# Patient Record
Sex: Male | Born: 1975 | Hispanic: Yes | Marital: Single | Smoking: Never smoker
Health system: Southern US, Community
[De-identification: ages and names within clinical notes are randomized; demographics above are authoritative.]

---

## 2016-10-11 DIAGNOSIS — M9905 Segmental and somatic dysfunction of pelvic region: Secondary | ICD-10-CM | POA: Diagnosis not present

## 2016-10-11 DIAGNOSIS — M50323 Other cervical disc degeneration at C6-C7 level: Secondary | ICD-10-CM | POA: Diagnosis not present

## 2016-10-11 DIAGNOSIS — M9901 Segmental and somatic dysfunction of cervical region: Secondary | ICD-10-CM | POA: Diagnosis not present

## 2016-11-01 DIAGNOSIS — M47816 Spondylosis without myelopathy or radiculopathy, lumbar region: Secondary | ICD-10-CM | POA: Diagnosis not present

## 2016-11-01 DIAGNOSIS — M47812 Spondylosis without myelopathy or radiculopathy, cervical region: Secondary | ICD-10-CM | POA: Diagnosis not present

## 2016-12-05 DIAGNOSIS — M109 Gout, unspecified: Secondary | ICD-10-CM | POA: Diagnosis not present

## 2017-02-28 DIAGNOSIS — K219 Gastro-esophageal reflux disease without esophagitis: Secondary | ICD-10-CM | POA: Diagnosis not present

## 2017-02-28 DIAGNOSIS — M722 Plantar fascial fibromatosis: Secondary | ICD-10-CM | POA: Diagnosis not present

## 2017-02-28 DIAGNOSIS — R030 Elevated blood-pressure reading, without diagnosis of hypertension: Secondary | ICD-10-CM | POA: Diagnosis not present

## 2017-02-28 DIAGNOSIS — R197 Diarrhea, unspecified: Secondary | ICD-10-CM | POA: Diagnosis not present

## 2017-03-30 DIAGNOSIS — K625 Hemorrhage of anus and rectum: Secondary | ICD-10-CM | POA: Diagnosis not present

## 2017-03-30 DIAGNOSIS — R195 Other fecal abnormalities: Secondary | ICD-10-CM | POA: Diagnosis not present

## 2017-03-30 DIAGNOSIS — K219 Gastro-esophageal reflux disease without esophagitis: Secondary | ICD-10-CM | POA: Diagnosis not present

## 2017-04-14 ENCOUNTER — Other Ambulatory Visit: Payer: Self-pay | Admitting: Family Medicine

## 2017-04-14 ENCOUNTER — Ambulatory Visit
Admission: RE | Admit: 2017-04-14 | Discharge: 2017-04-14 | Disposition: A | Discharge status: Home / Self Care | Payer: Self-pay | Source: Ambulatory Visit | Attending: Family Medicine | Admitting: Family Medicine

## 2017-04-14 ENCOUNTER — Ambulatory Visit
Admission: RE | Admit: 2017-04-14 | Discharge: 2017-04-14 | Disposition: A | Discharge status: Home / Self Care | Payer: 59 | Source: Ambulatory Visit | Attending: Family Medicine | Admitting: Family Medicine

## 2017-04-14 DIAGNOSIS — R52 Pain, unspecified: Secondary | ICD-10-CM

## 2017-04-14 DIAGNOSIS — J309 Allergic rhinitis, unspecified: Secondary | ICD-10-CM | POA: Diagnosis not present

## 2017-04-14 DIAGNOSIS — M542 Cervicalgia: Secondary | ICD-10-CM

## 2017-04-14 DIAGNOSIS — K219 Gastro-esophageal reflux disease without esophagitis: Secondary | ICD-10-CM | POA: Diagnosis not present

## 2017-04-24 DIAGNOSIS — G8929 Other chronic pain: Secondary | ICD-10-CM | POA: Diagnosis not present

## 2017-04-24 DIAGNOSIS — M79672 Pain in left foot: Secondary | ICD-10-CM | POA: Diagnosis not present

## 2017-04-24 DIAGNOSIS — M722 Plantar fascial fibromatosis: Secondary | ICD-10-CM | POA: Diagnosis not present

## 2017-05-05 DIAGNOSIS — J309 Allergic rhinitis, unspecified: Secondary | ICD-10-CM | POA: Diagnosis not present

## 2017-05-05 DIAGNOSIS — K219 Gastro-esophageal reflux disease without esophagitis: Secondary | ICD-10-CM | POA: Diagnosis not present

## 2017-05-05 DIAGNOSIS — Z23 Encounter for immunization: Secondary | ICD-10-CM | POA: Diagnosis not present

## 2017-05-05 DIAGNOSIS — M542 Cervicalgia: Secondary | ICD-10-CM | POA: Diagnosis not present

## 2017-05-15 DIAGNOSIS — R030 Elevated blood-pressure reading, without diagnosis of hypertension: Secondary | ICD-10-CM | POA: Diagnosis not present

## 2017-05-15 DIAGNOSIS — J45909 Unspecified asthma, uncomplicated: Secondary | ICD-10-CM | POA: Diagnosis not present

## 2017-05-15 DIAGNOSIS — J309 Allergic rhinitis, unspecified: Secondary | ICD-10-CM | POA: Diagnosis not present

## 2017-05-24 DIAGNOSIS — K219 Gastro-esophageal reflux disease without esophagitis: Secondary | ICD-10-CM | POA: Diagnosis not present

## 2017-05-24 DIAGNOSIS — K921 Melena: Secondary | ICD-10-CM | POA: Diagnosis not present

## 2017-06-27 DIAGNOSIS — J4530 Mild persistent asthma, uncomplicated: Secondary | ICD-10-CM | POA: Diagnosis not present

## 2017-06-27 DIAGNOSIS — B370 Candidal stomatitis: Secondary | ICD-10-CM | POA: Diagnosis not present

## 2017-07-05 DIAGNOSIS — M722 Plantar fascial fibromatosis: Secondary | ICD-10-CM | POA: Diagnosis not present

## 2017-07-05 DIAGNOSIS — M79672 Pain in left foot: Secondary | ICD-10-CM | POA: Diagnosis not present

## 2017-07-05 DIAGNOSIS — G8929 Other chronic pain: Secondary | ICD-10-CM | POA: Diagnosis not present

## 2017-07-17 DIAGNOSIS — M722 Plantar fascial fibromatosis: Secondary | ICD-10-CM | POA: Diagnosis not present

## 2017-07-20 DIAGNOSIS — M722 Plantar fascial fibromatosis: Secondary | ICD-10-CM | POA: Diagnosis not present

## 2017-07-21 DIAGNOSIS — Z0000 Encounter for general adult medical examination without abnormal findings: Secondary | ICD-10-CM | POA: Diagnosis not present

## 2017-07-21 DIAGNOSIS — Z13220 Encounter for screening for lipoid disorders: Secondary | ICD-10-CM | POA: Diagnosis not present

## 2017-07-21 DIAGNOSIS — R7309 Other abnormal glucose: Secondary | ICD-10-CM | POA: Diagnosis not present

## 2017-07-24 DIAGNOSIS — R197 Diarrhea, unspecified: Secondary | ICD-10-CM | POA: Diagnosis not present

## 2017-07-28 DIAGNOSIS — B370 Candidal stomatitis: Secondary | ICD-10-CM | POA: Diagnosis not present

## 2017-07-28 DIAGNOSIS — J309 Allergic rhinitis, unspecified: Secondary | ICD-10-CM | POA: Diagnosis not present

## 2017-07-28 DIAGNOSIS — J45909 Unspecified asthma, uncomplicated: Secondary | ICD-10-CM | POA: Diagnosis not present

## 2017-08-04 DIAGNOSIS — K921 Melena: Secondary | ICD-10-CM | POA: Diagnosis not present

## 2017-08-04 DIAGNOSIS — M722 Plantar fascial fibromatosis: Secondary | ICD-10-CM | POA: Diagnosis not present

## 2017-08-04 DIAGNOSIS — K219 Gastro-esophageal reflux disease without esophagitis: Secondary | ICD-10-CM | POA: Diagnosis not present

## 2017-08-10 DIAGNOSIS — M722 Plantar fascial fibromatosis: Secondary | ICD-10-CM | POA: Diagnosis not present

## 2017-08-14 DIAGNOSIS — M722 Plantar fascial fibromatosis: Secondary | ICD-10-CM | POA: Diagnosis not present

## 2017-08-18 DIAGNOSIS — M722 Plantar fascial fibromatosis: Secondary | ICD-10-CM | POA: Diagnosis not present

## 2017-08-21 DIAGNOSIS — M722 Plantar fascial fibromatosis: Secondary | ICD-10-CM | POA: Diagnosis not present

## 2017-08-21 DIAGNOSIS — J309 Allergic rhinitis, unspecified: Secondary | ICD-10-CM | POA: Diagnosis not present

## 2017-08-21 DIAGNOSIS — K219 Gastro-esophageal reflux disease without esophagitis: Secondary | ICD-10-CM | POA: Diagnosis not present

## 2017-08-25 DIAGNOSIS — M722 Plantar fascial fibromatosis: Secondary | ICD-10-CM | POA: Diagnosis not present

## 2017-08-29 DIAGNOSIS — M722 Plantar fascial fibromatosis: Secondary | ICD-10-CM | POA: Diagnosis not present

## 2017-09-05 DIAGNOSIS — M722 Plantar fascial fibromatosis: Secondary | ICD-10-CM | POA: Diagnosis not present

## 2017-09-12 DIAGNOSIS — M722 Plantar fascial fibromatosis: Secondary | ICD-10-CM | POA: Diagnosis not present

## 2017-10-04 DIAGNOSIS — M79671 Pain in right foot: Secondary | ICD-10-CM | POA: Diagnosis not present

## 2017-10-04 DIAGNOSIS — M545 Low back pain: Secondary | ICD-10-CM | POA: Diagnosis not present

## 2017-10-04 DIAGNOSIS — M722 Plantar fascial fibromatosis: Secondary | ICD-10-CM | POA: Diagnosis not present

## 2017-12-13 DIAGNOSIS — K5901 Slow transit constipation: Secondary | ICD-10-CM | POA: Diagnosis not present

## 2017-12-13 DIAGNOSIS — K219 Gastro-esophageal reflux disease without esophagitis: Secondary | ICD-10-CM | POA: Diagnosis not present

## 2018-04-06 DIAGNOSIS — R1084 Generalized abdominal pain: Secondary | ICD-10-CM | POA: Diagnosis not present

## 2018-07-08 DIAGNOSIS — R339 Retention of urine, unspecified: Secondary | ICD-10-CM | POA: Diagnosis not present

## 2018-10-22 DIAGNOSIS — J309 Allergic rhinitis, unspecified: Secondary | ICD-10-CM | POA: Diagnosis not present

## 2018-10-22 DIAGNOSIS — J45909 Unspecified asthma, uncomplicated: Secondary | ICD-10-CM | POA: Diagnosis not present

## 2018-10-26 DIAGNOSIS — N529 Male erectile dysfunction, unspecified: Secondary | ICD-10-CM | POA: Diagnosis not present

## 2018-10-26 DIAGNOSIS — J45909 Unspecified asthma, uncomplicated: Secondary | ICD-10-CM | POA: Diagnosis not present

## 2019-01-17 IMAGING — CR DG CERVICAL SPINE 2 OR 3 VIEWS
4 series · 4 of 4 positions shown · non-contrast
Comparison: 10/01/2015

CLINICAL DATA: 41-year-old male with chronic neck pain for 1 year.

EXAM:
CERVICAL SPINE - 2-3 VIEW

[w cervical spine lat]
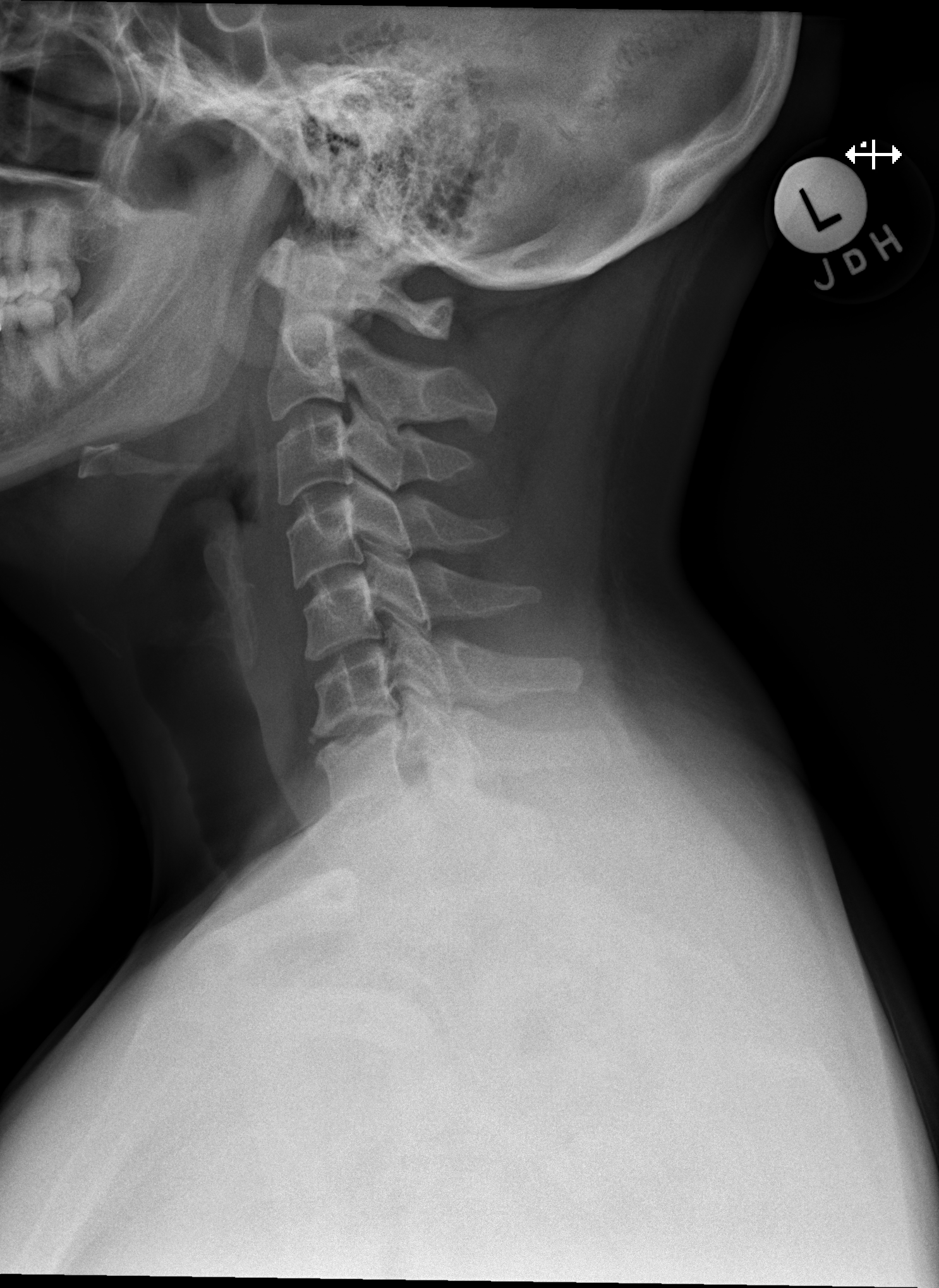

[w cervical spine ap]
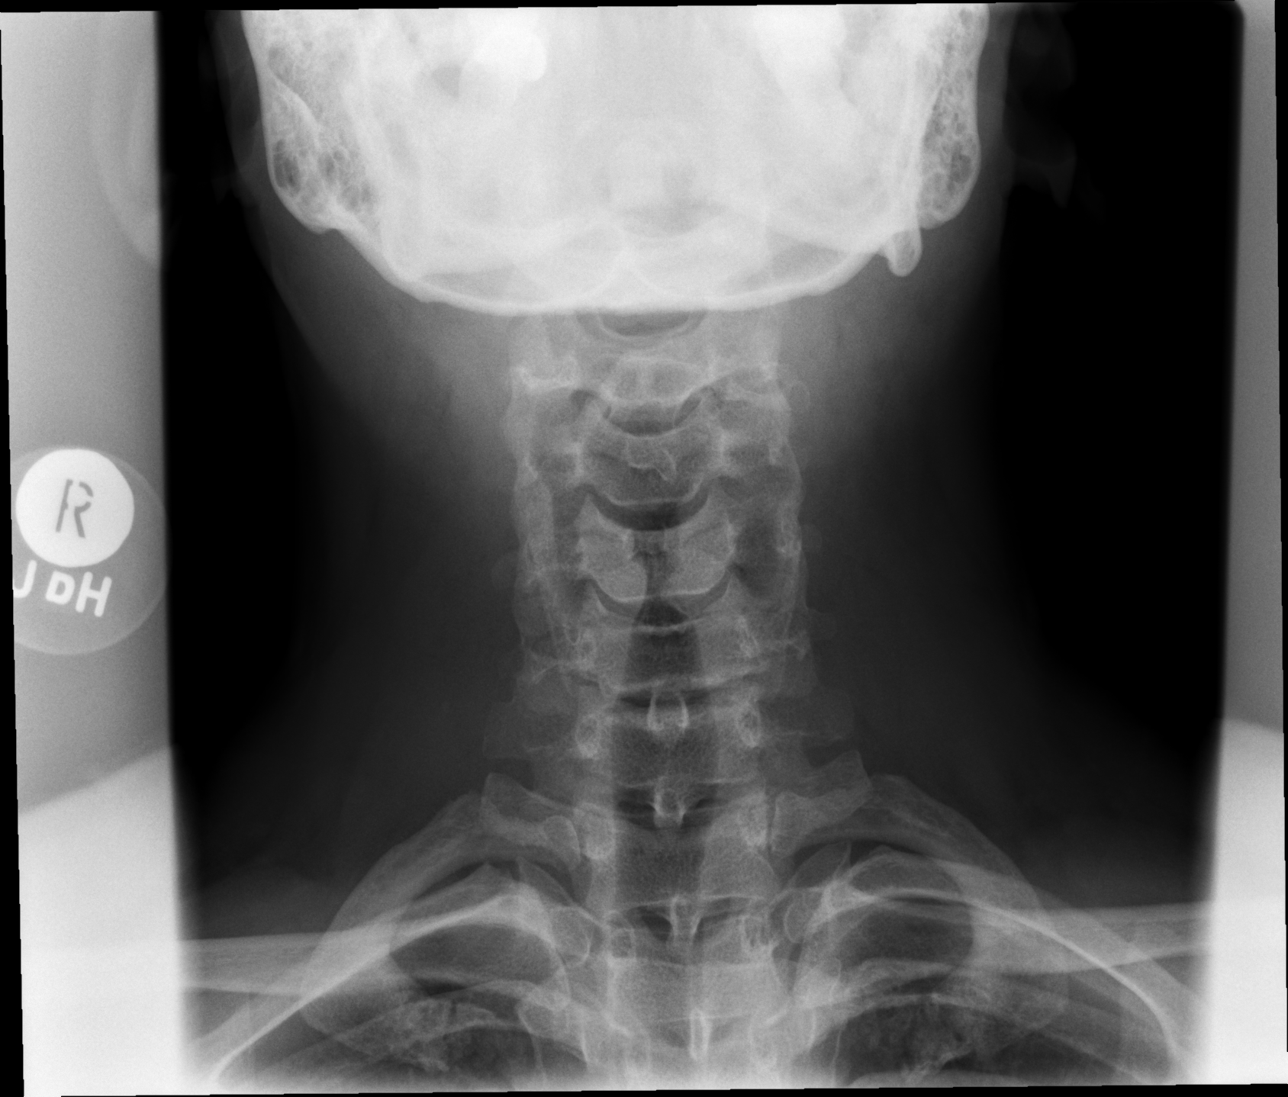

[w cervical swimmers]
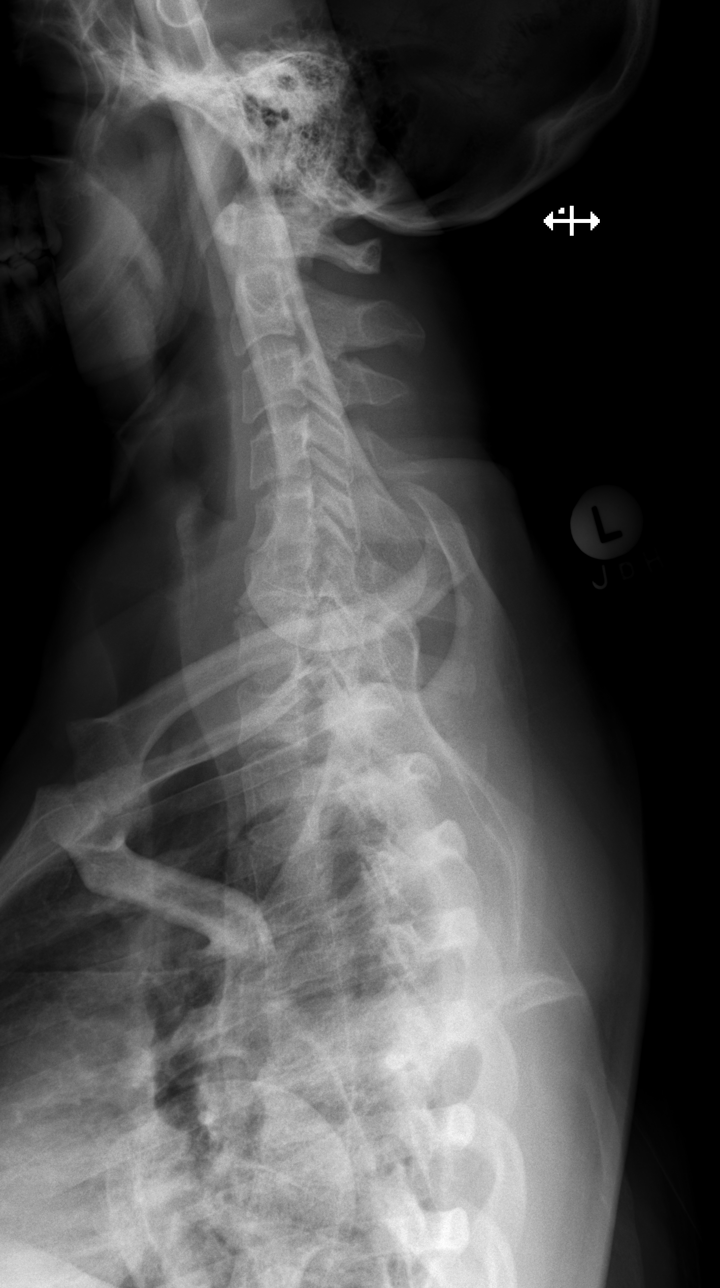

[w cervical spine odontoid]
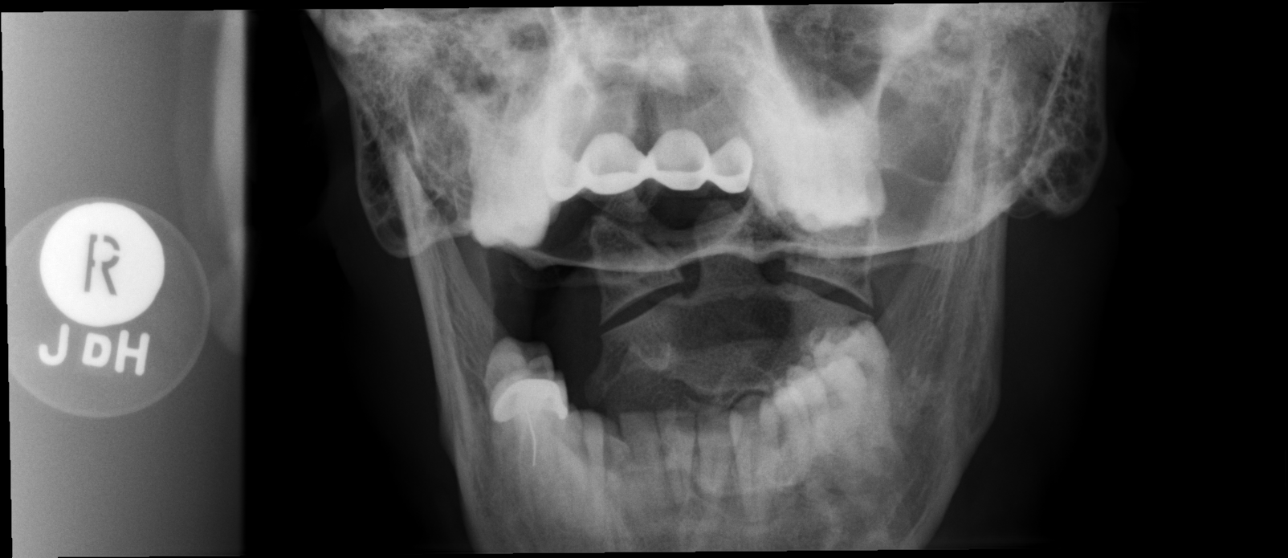

[4 of 4 positions shown; findings below may reference images not displayed]

FINDINGS: There is no evidence of cervical spine fracture or prevertebral soft
tissue swelling. Alignment is normal. No other significant bone
abnormalities are identified.
IMPRESSION: Negative cervical spine radiographs.

## 2020-10-21 ENCOUNTER — Other Ambulatory Visit: Payer: Self-pay

## 2020-10-21 ENCOUNTER — Encounter: Payer: Self-pay | Admitting: Cardiovascular Disease

## 2020-10-21 ENCOUNTER — Ambulatory Visit (INDEPENDENT_AMBULATORY_CARE_PROVIDER_SITE_OTHER): Payer: 59 | Admitting: Cardiovascular Disease

## 2020-10-21 DIAGNOSIS — R0789 Other chest pain: Secondary | ICD-10-CM | POA: Diagnosis not present

## 2020-10-21 NOTE — Patient Instructions (Signed)
Medication Instructions:  Your physician recommends that you continue on your current medications as directed. Please refer to the Current Medication list given to you today.  *If you need a refill on your cardiac medications before your next appointment, please call your pharmacy*   Testing/Procedures: Your physician has requested that you have an exercise tolerance test. For further information please visit [email or web address] Please also follow instruction sheet, as given.  This procedure is done at [address] [facility] 2nd Floor    Follow-Up: At [facility], you and your health needs are our priority.  As part of our continuing mission to provide you with exceptional heart care, we have created designated Provider Care Teams.  These Care Teams include your primary Cardiologist (physician) and Advanced Practice Providers (APPs -  Physician Assistants and Nurse Practitioners) who all work together to provide you with the care you need, when you need it.  We recommend signing up for the patient portal called "MyChart".  Sign up information is provided on this After Visit Summary.  MyChart is used to connect with patients for Virtual Visits (Telemedicine).  Patients are able to view lab/test results, encounter notes, upcoming appointments, etc.  Non-urgent messages can be sent to your provider as well.   To learn more about what you can do with MyChart, go to [email or web address].    Your next appointment:   No future appointments made at this time. We will see you on an as needed basis. If you need an appointment please call our office.   Provider:   Nanetta Batty, MD

## 2020-10-21 NOTE — Assessment & Plan Note (Signed)
William Hopkins was referred to me by Dr. Kirstie Peri for atypical chest pain.  He has no cardiac risk factors.  There is no family history.  He did have a cardiac evaluation in [place] approximately 7 or 8 years ago which was unremarkable.  He is been under a lot of stress at home for personal professional financial reasons.  He does complain of some pain in his neck rating to his left upper chest shoulder and upper extremity which sounds radicular.  I am going to get a routine GXT to further evaluate.

## 2020-10-21 NOTE — Progress Notes (Signed)
10/21/2020 William Hopkins   March 22, 1976  [id number]  Primary Physician Tally Joe, MD Primary Cardiologist: Runell Gess MD Nicholes Calamity, [place]  HPI:  William Hopkins is a 45 y.o. thin appearing married [place] male father of 2 children who works in Medical sales representative.  He was referred by Dr. Azucena Cecil for cardiovascular valuation because of atypical chest pain.  He has no cardiac risk factors.  He does run 2 to 4 miles a day and swims as well.  He has been working at home during the pandemic in front of a computer and has described pain in his neck shoulder upper left upper extremity and upper chest which does not sound ischemic in nature.   Current Meds  Medication Sig  . fluticasone (FLONASE) 50 MCG/ACT nasal spray Place into both nostrils daily.  . hydrOXYzine (ATARAX/VISTARIL) 25 MG tablet Take 25 mg by mouth as directed.  . loratadine (CLARITIN) 10 MG tablet Take 10 mg by mouth as needed for allergies.  . Saccharomyces boulardii (PROBIOTIC) 250 MG CAPS Take 1 capsule by mouth daily.  . sertraline (ZOLOFT) 25 MG tablet Take 25 mg by mouth daily.  . Vitamin D, Ergocalciferol, (DRISDOL) 1.25 MG (50000 UNIT) CAPS capsule Take 50,000 Units by mouth every 7 (seven) days.     Allergies  Allergen Reactions  . Milk-Related Compounds Diarrhea  . Omeprazole Diarrhea    Social History   Socioeconomic History  . Marital status: Single    Spouse name: Not on file  . Number of children: Not on file  . Years of education: Not on file  . Highest education level: Not on file  Occupational History  . Not on file  Tobacco Use  . Smoking status: Never Smoker  . Smokeless tobacco: Never Used  Substance and Sexual Activity  . Alcohol use: Never  . Drug use: Never  . Sexual activity: Yes    Partners: Female  Other Topics Concern  . Not on file  Social History Narrative  . Not on file   Social Determinants of Health   Financial Resource Strain: Not on  file  Food Insecurity: Not on file  Transportation Needs: Not on file  Physical Activity: Not on file  Stress: Not on file  Social Connections: Not on file  Intimate Partner Violence: Not on file     Review of Systems: General: negative for chills, fever, night sweats or weight changes.  Cardiovascular: negative for chest pain, dyspnea on exertion, edema, orthopnea, palpitations, paroxysmal nocturnal dyspnea or shortness of breath Dermatological: negative for rash Respiratory: negative for cough or wheezing Urologic: negative for hematuria Abdominal: negative for nausea, vomiting, diarrhea, bright red blood per rectum, melena, or hematemesis Neurologic: negative for visual changes, syncope, or dizziness All other systems reviewed and are otherwise negative except as noted above.    Blood pressure 122/78, pulse (!) 57, height 5\' 3"  (1.6 m), weight 132 lb (59.9 kg), SpO2 98 %.  General appearance: alert and no distress Neck: no adenopathy, no carotid bruit, no JVD, supple, symmetrical, trachea midline and thyroid not enlarged, symmetric, no tenderness/mass/nodules Lungs: clear to auscultation bilaterally Heart: regular rate and rhythm, S1, S2 normal, no murmur, click, rub or gallop Extremities: extremities normal, atraumatic, no cyanosis or edema Pulses: 2+ and symmetric Skin: Skin color, texture, turgor normal. No rashes or lesions Neurologic: Alert and oriented X 3, normal strength and tone. Normal symmetric reflexes. Normal coordination and gait  EKG sinus bradycardia 57 without ST or  T wave changes.  I personally reviewed this EKG.  ASSESSMENT AND PLAN:   Atypical chest pain Mr Walkins was referred to me by Dr. Kirstie Peri for atypical chest pain.  He has no cardiac risk factors.  There is no family history.  He did have a cardiac evaluation in [place] approximately 7 or 8 years ago which was unremarkable.  He is been under a lot of stress at home for personal  professional financial reasons.  He does complain of some pain in his neck rating to his left upper chest shoulder and upper extremity which sounds radicular.  I am going to get a routine GXT to further evaluate.      Runell Gess MD FACP,FACC,FAHA, [facility] 10/21/2020 3:09 PM

## 2020-10-26 ENCOUNTER — Other Ambulatory Visit (HOSPITAL_COMMUNITY)
Admission: RE | Admit: 2020-10-26 | Discharge: 2020-10-26 | Disposition: A | Discharge status: Home / Self Care | Payer: 59 | Source: Ambulatory Visit | Attending: Cardiovascular Disease | Admitting: Cardiovascular Disease

## 2020-10-26 DIAGNOSIS — Z20822 Contact with and (suspected) exposure to covid-19: Secondary | ICD-10-CM | POA: Insufficient documentation

## 2020-10-26 DIAGNOSIS — Z01812 Encounter for preprocedural laboratory examination: Secondary | ICD-10-CM | POA: Diagnosis not present

## 2020-10-26 LAB — SARS CORONAVIRUS 2 (TAT 6-24 HRS): SARS Coronavirus 2: NEGATIVE

## 2020-10-27 ENCOUNTER — Telehealth: Payer: Self-pay | Admitting: Cardiovascular Disease

## 2020-10-27 NOTE — Addendum Note (Signed)
Addended by: Runell Gess on: 10/27/2020 08:51 AM   Modules accepted: Orders

## 2020-10-27 NOTE — Telephone Encounter (Signed)
ETT tomorrow  Nuclear aware and will call patient to discuss.

## 2020-10-27 NOTE — Telephone Encounter (Signed)
New message:     Patient calling stating that he thought some one was going to call him with instruction on his apt tomorrow.

## 2020-10-28 ENCOUNTER — Ambulatory Visit (HOSPITAL_COMMUNITY)
Admission: RE | Admit: 2020-10-28 | Discharge: 2020-10-28 | Disposition: A | Discharge status: Home / Self Care | Payer: 59 | Source: Ambulatory Visit | Attending: Cardiovascular Disease | Admitting: Cardiovascular Disease

## 2020-10-28 ENCOUNTER — Other Ambulatory Visit: Payer: Self-pay

## 2020-10-28 DIAGNOSIS — R0789 Other chest pain: Secondary | ICD-10-CM | POA: Diagnosis not present

## 2020-10-29 LAB — EXERCISE TOLERANCE TEST
Estimated workload: 20 METS
Exercise duration (min): 18 min
Exercise duration (sec): 0 s
MPHR: 178 {beats}/min
Peak HR: 181 {beats}/min
Percent HR: 101 %
Rest HR: 55 {beats}/min

## 2022-06-03 DIAGNOSIS — N471 Phimosis: Secondary | ICD-10-CM | POA: Diagnosis not present

## 2022-06-03 DIAGNOSIS — R3914 Feeling of incomplete bladder emptying: Secondary | ICD-10-CM | POA: Diagnosis not present

## 2022-06-03 DIAGNOSIS — R109 Unspecified abdominal pain: Secondary | ICD-10-CM | POA: Diagnosis not present

## 2022-09-09 DIAGNOSIS — F419 Anxiety disorder, unspecified: Secondary | ICD-10-CM | POA: Diagnosis not present

## 2022-09-09 DIAGNOSIS — Z0000 Encounter for general adult medical examination without abnormal findings: Secondary | ICD-10-CM | POA: Diagnosis not present

## 2022-09-09 DIAGNOSIS — D229 Melanocytic nevi, unspecified: Secondary | ICD-10-CM | POA: Diagnosis not present

## 2022-09-09 DIAGNOSIS — E559 Vitamin D deficiency, unspecified: Secondary | ICD-10-CM | POA: Diagnosis not present

## 2022-09-09 DIAGNOSIS — E781 Pure hyperglyceridemia: Secondary | ICD-10-CM | POA: Diagnosis not present

## 2022-12-18 DIAGNOSIS — B009 Herpesviral infection, unspecified: Secondary | ICD-10-CM | POA: Diagnosis not present

## 2022-12-18 DIAGNOSIS — R21 Rash and other nonspecific skin eruption: Secondary | ICD-10-CM | POA: Diagnosis not present

## 2022-12-18 DIAGNOSIS — L0100 Impetigo, unspecified: Secondary | ICD-10-CM | POA: Diagnosis not present

## 2023-01-04 DIAGNOSIS — J4530 Mild persistent asthma, uncomplicated: Secondary | ICD-10-CM | POA: Diagnosis not present

## 2023-01-04 DIAGNOSIS — J069 Acute upper respiratory infection, unspecified: Secondary | ICD-10-CM | POA: Diagnosis not present

## 2023-04-07 DIAGNOSIS — E559 Vitamin D deficiency, unspecified: Secondary | ICD-10-CM | POA: Diagnosis not present

## 2023-04-07 DIAGNOSIS — J309 Allergic rhinitis, unspecified: Secondary | ICD-10-CM | POA: Diagnosis not present

## 2023-04-07 DIAGNOSIS — F419 Anxiety disorder, unspecified: Secondary | ICD-10-CM | POA: Diagnosis not present

## 2023-05-09 DIAGNOSIS — F411 Generalized anxiety disorder: Secondary | ICD-10-CM | POA: Diagnosis not present

## 2023-05-18 DIAGNOSIS — Z01818 Encounter for other preprocedural examination: Secondary | ICD-10-CM | POA: Diagnosis not present

## 2023-05-24 DIAGNOSIS — R3914 Feeling of incomplete bladder emptying: Secondary | ICD-10-CM | POA: Diagnosis not present

## 2023-05-24 DIAGNOSIS — F524 Premature ejaculation: Secondary | ICD-10-CM | POA: Diagnosis not present

## 2023-05-30 DIAGNOSIS — F411 Generalized anxiety disorder: Secondary | ICD-10-CM | POA: Diagnosis not present

## 2023-08-03 DIAGNOSIS — F411 Generalized anxiety disorder: Secondary | ICD-10-CM | POA: Diagnosis not present

## 2023-08-08 DIAGNOSIS — B001 Herpesviral vesicular dermatitis: Secondary | ICD-10-CM | POA: Diagnosis not present

## 2023-08-23 DIAGNOSIS — F411 Generalized anxiety disorder: Secondary | ICD-10-CM | POA: Diagnosis not present

## 2023-10-19 DIAGNOSIS — F411 Generalized anxiety disorder: Secondary | ICD-10-CM | POA: Diagnosis not present

## 2023-12-08 DIAGNOSIS — Z0000 Encounter for general adult medical examination without abnormal findings: Secondary | ICD-10-CM | POA: Diagnosis not present

## 2023-12-08 DIAGNOSIS — Z23 Encounter for immunization: Secondary | ICD-10-CM | POA: Diagnosis not present

## 2023-12-08 DIAGNOSIS — E781 Pure hyperglyceridemia: Secondary | ICD-10-CM | POA: Diagnosis not present

## 2023-12-08 DIAGNOSIS — E559 Vitamin D deficiency, unspecified: Secondary | ICD-10-CM | POA: Diagnosis not present

## 2023-12-08 DIAGNOSIS — F419 Anxiety disorder, unspecified: Secondary | ICD-10-CM | POA: Diagnosis not present

## 2024-01-18 DIAGNOSIS — F411 Generalized anxiety disorder: Secondary | ICD-10-CM | POA: Diagnosis not present

## 2024-05-02 DIAGNOSIS — F411 Generalized anxiety disorder: Secondary | ICD-10-CM | POA: Diagnosis not present

## 2024-06-10 DIAGNOSIS — J309 Allergic rhinitis, unspecified: Secondary | ICD-10-CM | POA: Diagnosis not present

## 2024-06-10 DIAGNOSIS — R0789 Other chest pain: Secondary | ICD-10-CM | POA: Diagnosis not present

## 2024-06-10 DIAGNOSIS — E559 Vitamin D deficiency, unspecified: Secondary | ICD-10-CM | POA: Diagnosis not present

## 2024-06-10 DIAGNOSIS — B349 Viral infection, unspecified: Secondary | ICD-10-CM | POA: Diagnosis not present

## 2024-06-10 DIAGNOSIS — R053 Chronic cough: Secondary | ICD-10-CM | POA: Diagnosis not present

## 2024-06-10 DIAGNOSIS — U071 COVID-19: Secondary | ICD-10-CM | POA: Diagnosis not present

## 2024-07-30 DIAGNOSIS — R0789 Other chest pain: Secondary | ICD-10-CM | POA: Diagnosis not present

## 2024-07-30 DIAGNOSIS — J45909 Unspecified asthma, uncomplicated: Secondary | ICD-10-CM | POA: Diagnosis not present

## 2024-07-30 DIAGNOSIS — F419 Anxiety disorder, unspecified: Secondary | ICD-10-CM | POA: Diagnosis not present

## 2024-07-30 DIAGNOSIS — B349 Viral infection, unspecified: Secondary | ICD-10-CM | POA: Diagnosis not present
